# Patient Record
Sex: Male | Born: 1995 | Race: Black or African American | Hispanic: No | Marital: Single | Smoking: Never smoker
Health system: Southern US, Community
[De-identification: ages and names within clinical notes are randomized; demographics above are authoritative.]

## PROBLEM LIST (undated history)

## (undated) DIAGNOSIS — J45909 Unspecified asthma, uncomplicated: Secondary | ICD-10-CM

---
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
## 2017-01-10 DIAGNOSIS — Z9109 Other allergy status, other than to drugs and biological substances: Secondary | ICD-10-CM | POA: Insufficient documentation

## 2017-01-10 DIAGNOSIS — J45909 Unspecified asthma, uncomplicated: Secondary | ICD-10-CM | POA: Insufficient documentation

## 2019-04-26 ENCOUNTER — Ambulatory Visit: Payer: Self-pay | Attending: Internal Medicine

## 2019-04-26 DIAGNOSIS — Z23 Encounter for immunization: Secondary | ICD-10-CM

## 2019-04-26 NOTE — Progress Notes (Signed)
   Covid-19 Vaccination Clinic  Name:  John Levy    MRN: [id number] DOB: November 08, 1995  04/26/2019  [name] was observed post Covid-19 immunization for 15 minutes without incident. He was provided with Vaccine Information Sheet and instruction to access the V-Safe system.   [name] was instructed to call 911 with any severe reactions post vaccine: Marland Kitchen Difficulty breathing  . Swelling of face and throat  . A fast heartbeat  . A bad rash all over body  . Dizziness and weakness   Immunizations Administered    Name Date Dose VIS Date Route   Pfizer COVID-19 Vaccine 04/26/2019  2:18 PM 0.3 mL 12/29/2018 Intramuscular   Manufacturer: [employer], [employer]   Lot: [id number]   NDC: [address]-1

## 2019-05-21 ENCOUNTER — Ambulatory Visit: Payer: Self-pay | Attending: Internal Medicine

## 2019-05-21 DIAGNOSIS — Z23 Encounter for immunization: Secondary | ICD-10-CM

## 2019-05-21 NOTE — Progress Notes (Signed)
   Covid-19 Vaccination Clinic  Name:  John Levy    MRN: [id number] DOB: February 25, 1995  05/21/2019  John Levy was observed post Covid-19 immunization for 15 minutes without incident. He was provided with Vaccine Information Sheet and instruction to access the V-Safe system.   John Levy was instructed to call 911 with any severe reactions post vaccine: Marland Kitchen Difficulty breathing  . Swelling of face and throat  . A fast heartbeat  . A bad rash all over body  . Dizziness and weakness   Immunizations Administered    Name Date Dose VIS Date Route   Pfizer COVID-19 Vaccine 05/21/2019 10:00 AM 0.3 mL 03/14/2018 Intramuscular   Manufacturer: [employer], [employer]   Lot: [id number]   NDC: [address]-8

## 2021-12-09 ENCOUNTER — Ambulatory Visit
Admission: EM | Admit: 2021-12-09 | Discharge: 2021-12-09 | Disposition: A | Discharge status: Home / Self Care | Payer: BC Managed Care – PPO | Attending: Internal Medicine | Admitting: Internal Medicine

## 2021-12-09 ENCOUNTER — Ambulatory Visit (INDEPENDENT_AMBULATORY_CARE_PROVIDER_SITE_OTHER): Payer: BC Managed Care – PPO

## 2021-12-09 ENCOUNTER — Ambulatory Visit
Admission: EM | Admit: 2021-12-09 | Discharge: 2021-12-09 | Discharge status: Against Medical Advice | Payer: BC Managed Care – PPO

## 2021-12-09 VITALS — BP 120/80 | HR 85 | Temp 97.2°F | Resp 16

## 2021-12-09 DIAGNOSIS — M25511 Pain in right shoulder: Secondary | ICD-10-CM

## 2021-12-09 DIAGNOSIS — M5459 Other low back pain: Secondary | ICD-10-CM

## 2021-12-09 DIAGNOSIS — M545 Low back pain, unspecified: Secondary | ICD-10-CM

## 2021-12-09 DIAGNOSIS — S39012A Strain of muscle, fascia and tendon of lower back, initial encounter: Secondary | ICD-10-CM

## 2021-12-09 MED ORDER — METHOCARBAMOL 500 MG PO TABS: 500.0000 mg | ORAL_TABLET | Freq: Two times a day (BID) | ORAL | 0 refills | Status: AC | PRN

## 2021-12-09 NOTE — Discharge Instructions (Signed)
It appears you have strained your back.  I have prescribed a muscle relaxer to take as needed.  Please be advised that this can cause drowsiness so do not drive or drink alcohol with taking it.  You may alternate ice and heat to affected area of your back as well.  Your shoulder x-ray was normal.  Follow-up with orthopedist at provided contact information for further evaluation and management given this is a chronic issue.

## 2021-12-09 NOTE — ED Triage Notes (Signed)
Pt is present today with lower back pain and right shoulder pain. Pt states that his back pain started last night and the shoulder pain started a few years ago. Pt states that his shoulder normally pops out of place and pops back in the socket and he notices numbness afterwards

## 2021-12-09 NOTE — ED Provider Notes (Signed)
[facility]    CSN: [id number] Arrival date & time: 12/09/21  1700      History   Chief Complaint Chief Complaint  Patient presents with   Back Pain   Shoulder Pain    HPI John Levy is a 26 y.o. male.   Patient presents with right shoulder and right lower back pain.  Patient reports that right lower back pain started yesterday.  He denies any obvious injury but does report that he does all a lot of weight lifting.  He denies urinary frequency, urinary or bowel continence, hematuria, saddle anesthesia.  Patient has taken Advil with some improvement in symptoms.  Patient also reporting some right shoulder discomfort.  He states that since 2016, his shoulder will pop in and out of place with certain movements.  He states approximately 3 days ago he was walking up the steps and grabbed the guardrail while falling.  He states that he had subsequent pain after shoulder felt like it "popped out of place".  He states that he he can easily pop it back in place and the pain will subside.  He has never been seen by orthopedist or PCP for this.   Back Pain Shoulder Pain   Past Medical History:  Diagnosis Date   Asthma     There are no problems to display for this patient.   History reviewed. No pertinent surgical history.     Home Medications    Prior to Admission medications   Medication Sig Start Date End Date Taking? Authorizing Provider  methocarbamol (ROBAXIN) 500 MG tablet Take 1 tablet (500 mg total) by mouth 2 (two) times daily as needed for muscle spasms. 12/09/21  Yes Jay Haskew, Acie Fredrickson, FNP  albuterol (PROVENTIL HFA;VENTOLIN HFA) 108 (90 Base) MCG/ACT inhaler Inhale 1-2 puffs into the lungs every 6 (six) hours as needed for wheezing or shortness of breath. 06/14/15   Antony Madura, PA-C  fexofenadine (ALLEGRA) 180 MG tablet Take 180 mg by mouth daily as needed for allergies or rhinitis.    [provider]  predniSONE (DELTASONE) 20 MG tablet Take 2  tablets (40 mg total) by mouth daily. 06/14/15   Antony Madura, PA-C    Family History History reviewed. No pertinent family history.  Social History Social History   Tobacco Use   Smoking status: Never   Smokeless tobacco: Never  Substance Use Topics   Alcohol use: No   Drug use: No     Allergies   Patient has no known allergies.   Review of Systems Review of Systems Per HPI  Physical Exam Triage Vital Signs ED Triage Vitals [12/09/21 1718]  Enc Vitals Group     BP 120/80     Pulse Rate 85     Resp 16     Temp (!) 97.2 F (36.2 C)     Temp src      SpO2 95 %     Weight      Height      Head Circumference      Peak Flow      Pain Score 7     Pain Loc      Pain Edu?      Excl. in GC?    No data found.  Updated Vital Signs BP 120/80   Pulse 85   Temp (!) 97.2 F (36.2 C)   Resp 16   SpO2 95%   Visual Acuity Right Eye Distance:   Left Eye Distance:  Bilateral Distance:    Right Eye Near:   Left Eye Near:    Bilateral Near:     Physical Exam Constitutional:      General: He is not in acute distress.    Appearance: Normal appearance. He is not toxic-appearing or diaphoretic.  HENT:     Head: Normocephalic and atraumatic.  Eyes:     Extraocular Movements: Extraocular movements intact.     Conjunctiva/sclera: Conjunctivae normal.  Pulmonary:     Effort: Pulmonary effort is normal.  Musculoskeletal:       Back:     Comments: No tenderness to palpation to right lower back.  No direct spinal tenderness, crepitus, step-off noted.  Patient reports movement exacerbates pain.  No tenderness to palpation throughout shoulder or upper extremity.  Patient has full range of motion of upper extremity.  Grip strength 5/5.  Neurovascular intact.  Neurological:     General: No focal deficit present.     Mental Status: He is alert and oriented to person, place, and time. Mental status is at baseline.     Deep Tendon Reflexes: Reflexes are normal and  symmetric.  Psychiatric:        Mood and Affect: Mood normal.        Behavior: Behavior normal.        Thought Content: Thought content normal.        Judgment: Judgment normal.      UC Treatments / Results  Labs (all labs ordered are listed, but only abnormal results are displayed) Labs Reviewed - No data to display  EKG   Radiology DG Shoulder Right  Result Date: 12/09/2021 CLINICAL DATA:  Right shoulder pain. EXAM: RIGHT SHOULDER - 2+ VIEW COMPARISON:  None Available. FINDINGS: The glenohumeral and AC joints are maintained. No acute bony findings or abnormal soft tissue calcifications. IMPRESSION: No fracture or dislocation. Electronically Signed   By: Marijo Sanes M.D.   On: 12/09/2021 17:53    Procedures Procedures (including critical care time)  Medications Ordered in UC Medications - No data to display  Initial Impression / Assessment and Plan / UC Course  I have reviewed the triage vital signs and the nursing notes.  Pertinent labs & imaging results that were available during my care of the patient were reviewed by me and considered in my medical decision making (see chart for details).     Suspect lumbar strain causing back pain given physical exam.  Given no direct spinal tenderness or injury, will defer imaging.  Will treat with muscle relaxer.  Patient advised that muscle relaxer can cause drowsiness and do not drive or drink alcohol while taking it.  Discussed safe over-the-counter pain relievers to supplement with.  Discussed alternating ice and heat to affected area as well.  Right shoulder x-ray was negative for any acute bony abnormality.  Given the chronicity of issue, patient was advised to follow-up with orthopedist at provided contact information for further evaluation and management.  Discussed supportive care as well. discussed strict return and ER precautions for all chief complaints today.  Patient verbalized understanding and was agreeable with  plan. Final Clinical Impressions(s) / UC Diagnoses   Final diagnoses:  Acute right-sided low back pain without sciatica  Strain of lumbar region, initial encounter  Acute pain of right shoulder     Discharge Instructions      It appears you have strained your back.  I have prescribed a muscle relaxer to take as needed.  Please be advised  that this can cause drowsiness so do not drive or drink alcohol with taking it.  You may alternate ice and heat to affected area of your back as well.  Your shoulder x-ray was normal.  Follow-up with orthopedist at provided contact information for further evaluation and management given this is a chronic issue.     ED Prescriptions     Medication Sig Dispense Auth. Provider   methocarbamol (ROBAXIN) 500 MG tablet Take 1 tablet (500 mg total) by mouth 2 (two) times daily as needed for muscle spasms. 20 tablet [place], [address] [address], [place]      I have reviewed the PDMP during this encounter.   Gustavus Bryant, [place] 12/09/21 [phone]

## 2022-01-26 ENCOUNTER — Ambulatory Visit: Payer: BC Managed Care – PPO | Admitting: Family Medicine

## 2022-01-26 ENCOUNTER — Encounter: Payer: Self-pay | Admitting: Family Medicine

## 2022-01-26 VITALS — BP 129/83 | HR 80 | Temp 98.3°F | Resp 16 | Ht 67.0 in | Wt 188.2 lb

## 2022-01-26 DIAGNOSIS — J4520 Mild intermittent asthma, uncomplicated: Secondary | ICD-10-CM | POA: Insufficient documentation

## 2022-01-26 DIAGNOSIS — J302 Other seasonal allergic rhinitis: Secondary | ICD-10-CM | POA: Insufficient documentation

## 2022-01-26 DIAGNOSIS — Z7689 Persons encountering health services in other specified circumstances: Secondary | ICD-10-CM

## 2022-01-26 DIAGNOSIS — J4540 Moderate persistent asthma, uncomplicated: Secondary | ICD-10-CM

## 2022-01-26 MED ORDER — ALBUTEROL SULFATE HFA 108 (90 BASE) MCG/ACT IN AERS: 2.0000 | INHALATION_SPRAY | RESPIRATORY_TRACT | 3 refills | Status: DC | PRN

## 2022-01-26 MED ORDER — MONTELUKAST SODIUM 10 MG PO TABS: 10.0000 mg | ORAL_TABLET | Freq: Every day | ORAL | 1 refills | Status: DC

## 2022-01-26 MED ORDER — FLUTICASONE PROPIONATE HFA 220 MCG/ACT IN AERO: INHALATION_SPRAY | RESPIRATORY_TRACT | 5 refills | Status: DC

## 2022-01-26 MED ORDER — ALBUTEROL SULFATE (2.5 MG/3ML) 0.083% IN NEBU: INHALATION_SOLUTION | RESPIRATORY_TRACT | 3 refills | Status: DC

## 2022-01-26 NOTE — Progress Notes (Unsigned)
Patient is here to established care with provider today. Patient has many health concern they would like to discuss with provider today  Care gaps discuss at appointment today  

## 2022-01-27 ENCOUNTER — Encounter: Payer: Self-pay | Admitting: Family Medicine

## 2022-01-27 NOTE — Progress Notes (Signed)
New Patient Office Visit  Subjective    Patient ID: John Levy, male    DOB: 1995-05-03  Age: 27 y.o. MRN: [id number]  CC:  Chief Complaint  Patient presents with   Establish Care    HPI John Levy presents to establish care and for review of asthma. He denies acute complaints.    Outpatient Encounter Medications as of 01/26/2022  Medication Sig   [DISCONTINUED] albuterol (VENTOLIN HFA) 108 (90 Base) MCG/ACT inhaler Inhale 2 puffs into the lungs every 4 (four) hours as needed.   [DISCONTINUED] montelukast (SINGULAIR) 10 MG tablet Take 1 tablet by mouth at bedtime.   albuterol (PROVENTIL HFA;VENTOLIN HFA) 108 (90 Base) MCG/ACT inhaler Inhale 1-2 puffs into the lungs every 6 (six) hours as needed for wheezing or shortness of breath.   albuterol (PROVENTIL) (2.5 MG/3ML) 0.083% nebulizer solution USE 1 VIAL IN NEBULIZER EVERY 4 TO 6 HOURS AS NEEDED   albuterol (VENTOLIN HFA) 108 (90 Base) MCG/ACT inhaler Inhale 2 puffs into the lungs every 4 (four) hours as needed.   fexofenadine (ALLEGRA) 180 MG tablet Take 180 mg by mouth daily as needed for allergies or rhinitis.   fluticasone (FLOVENT HFA) 220 MCG/ACT inhaler INHALE 1 PUFF BY MOUTH TWICE DAILY TO  PREVENT  ASTHMA  SYMPTOMS  RINSE  MOUTH  WELL  AFTER  USE   methocarbamol (ROBAXIN) 500 MG tablet Take 1 tablet (500 mg total) by mouth 2 (two) times daily as needed for muscle spasms.   montelukast (SINGULAIR) 10 MG tablet Take 1 tablet (10 mg total) by mouth at bedtime.   [DISCONTINUED] albuterol (PROVENTIL) (2.5 MG/3ML) 0.083% nebulizer solution USE 1 VIAL IN NEBULIZER EVERY 4 TO 6 HOURS AS NEEDED   [DISCONTINUED] fluticasone (FLOVENT HFA) 220 MCG/ACT inhaler INHALE 1 PUFF BY MOUTH TWICE DAILY TO  PREVENT  ASTHMA  SYMPTOMS  RINSE  MOUTH  WELL  AFTER  USE   [DISCONTINUED] predniSONE (DELTASONE) 20 MG tablet Take 2 tablets (40 mg total) by mouth daily.   No facility-administered encounter medications on file as of 01/26/2022.    Past  Medical History:  Diagnosis Date   Asthma     No past surgical history on file.  No family history on file.  Social History   Socioeconomic History   Marital status: Single    Spouse name: Not on file   Number of children: Not on file   Years of education: Not on file   Highest education level: Not on file  Occupational History   Not on file  Tobacco Use   Smoking status: Never   Smokeless tobacco: Never  Substance and Sexual Activity   Alcohol use: No   Drug use: No   Sexual activity: Never  Other Topics Concern   Not on file  Social History Narrative   Not on file   Social Determinants of Health   Financial Resource Strain: Not on file  Food Insecurity: Not on file  Transportation Needs: Not on file  Physical Activity: Not on file  Stress: Not on file  Social Connections: Not on file  Intimate Partner Violence: Not on file    Review of Systems  All other systems reviewed and are negative.       Objective    BP 129/83   Pulse 80   Temp 98.3 F (36.8 C) (Oral)   Resp 16   Ht 5\' 7"  (1.702 m)   Wt 188 lb 3.2 oz (85.4 kg)   SpO2 95%  BMI 29.48 kg/m   Physical Exam Vitals and nursing note reviewed.  Constitutional:      General: He is not in acute distress. Cardiovascular:     Rate and Rhythm: Normal rate and regular rhythm.  Pulmonary:     Effort: Pulmonary effort is normal.     Breath sounds: Normal breath sounds.  Abdominal:     Palpations: Abdomen is soft.     Tenderness: There is no abdominal tenderness.  Neurological:     General: No focal deficit present.     Mental Status: He is alert and oriented to person, place, and time.         Assessment & Plan:   1. Moderate persistent asthma without complication Discussed appropriate management. Flovent , albuterol, and singulair prescribe.   2. Encounter to establish care     Return in about 3 months (around 04/27/2022) for follow up, physical.   Becky Sax, MD

## 2022-02-10 ENCOUNTER — Telehealth: Payer: Self-pay | Admitting: Family Medicine

## 2022-02-10 ENCOUNTER — Other Ambulatory Visit: Payer: Self-pay | Admitting: Family Medicine

## 2022-02-10 NOTE — Telephone Encounter (Signed)
[facility] no longer carries fluticasone (FLOVENT HFA) 220 MCG/ACT inhaler / they need an alternate sent to the pharmacy / please call pt when new RX is sent

## 2022-02-10 NOTE — Telephone Encounter (Signed)
Please advise patient.  

## 2022-02-12 ENCOUNTER — Other Ambulatory Visit: Payer: Self-pay | Admitting: Family Medicine

## 2022-02-12 MED ORDER — FLUTICASONE PROPIONATE HFA 220 MCG/ACT IN AERO: 2.0000 | INHALATION_SPRAY | Freq: Two times a day (BID) | RESPIRATORY_TRACT | 12 refills | Status: DC

## 2022-02-15 ENCOUNTER — Other Ambulatory Visit: Payer: Self-pay | Admitting: Family Medicine

## 2022-02-22 ENCOUNTER — Ambulatory Visit: Payer: Self-pay | Admitting: *Deleted

## 2022-02-22 NOTE — Telephone Encounter (Signed)
Mother is calling back concerned about about med but understanding For Dr. Redmond Pulling   See notes. Triage today...stating  He needs an alternative inhaler called in.    Doesn't look like it was called in from last week.

## 2022-02-22 NOTE — Telephone Encounter (Signed)
Reason for Disposition  [1] Prescription not at pharmacy AND [2] was prescribed by PCP recently (Exception: [facility] has access to EMR and prescription is recorded there. Go to Home Care and confirm for pharmacy.)  Answer Assessment - Initial Assessment Questions 1. NAME of MEDICINE: "What medicine(s) are you calling about?"     I'm to have a FloVent.   Insurance not covering this anymore.    My dr. Was to send in a different medication for it.    2. QUESTION: "What is your question?" (e.g., double dose of medicine, side effect)     Dr. Redmond Pulling was supposed to send in an alternative for me.   The pharmacy does not have it. 3. PRESCRIBER: "Who prescribed the medicine?" Reason: if prescribed by specialist, call should be referred to that group.     Dr. Dorna Mai 4. SYMPTOMS: "Do you have any symptoms?" If Yes, ask: "What symptoms are you having?"  "How bad are the symptoms (e.g., mild, moderate, severe)     N/A 5. PREGNANCY:  "Is there any chance that you are pregnant?" "When was your last menstrual period?"     N/A  Protocols used: Medication Question Call-A-AH

## 2022-02-22 NOTE — Telephone Encounter (Signed)
  Chief Complaint: Dr. Redmond Pulling was going to call in a different inhaler in place of the FloVent inhaler because he can no longer get that brand.   The alternative has not been called into his pharmacy.    Symptoms: N/A Frequency: N/A Pertinent Negatives: Patient denies N/A Disposition: [] ED /[] Urgent Care (no appt availability in office) / [] Appointment(In office/virtual)/ []  [facility]/ [] Home Care/ [] Refused Recommended Disposition /[] [facility]/ [x]  Follow-up with PCP Additional Notes: Needs an alternative inhaler called in in place of the FloVent inhaler he can no longer get. He would like someone to call him back and let him know when it has been sent.

## 2022-02-23 ENCOUNTER — Other Ambulatory Visit: Payer: Self-pay | Admitting: Family Medicine

## 2022-02-23 MED ORDER — FLUTICASONE PROPIONATE (INHAL) 250 MCG/ACT IN AEPB: 250.0000 ug | INHALATION_SPRAY | Freq: Two times a day (BID) | RESPIRATORY_TRACT | 2 refills | Status: DC

## 2022-04-29 ENCOUNTER — Encounter: Payer: BC Managed Care – PPO | Admitting: Family Medicine

## 2022-07-01 ENCOUNTER — Other Ambulatory Visit: Payer: Self-pay | Admitting: Family Medicine

## 2022-07-08 ENCOUNTER — Other Ambulatory Visit: Payer: Self-pay | Admitting: *Deleted

## 2023-03-16 ENCOUNTER — Emergency Department (HOSPITAL_COMMUNITY)
Admission: EM | Admit: 2023-03-16 | Discharge: 2023-03-16 | Disposition: A | Discharge status: Home / Self Care | Payer: Medicaid Other | Attending: Emergency Medicine | Admitting: Emergency Medicine

## 2023-03-16 ENCOUNTER — Encounter (HOSPITAL_COMMUNITY): Payer: Self-pay | Admitting: *Deleted

## 2023-03-16 ENCOUNTER — Other Ambulatory Visit: Payer: Self-pay

## 2023-03-16 DIAGNOSIS — Z7951 Long term (current) use of inhaled steroids: Secondary | ICD-10-CM | POA: Insufficient documentation

## 2023-03-16 DIAGNOSIS — R062 Wheezing: Secondary | ICD-10-CM | POA: Diagnosis present

## 2023-03-16 DIAGNOSIS — J4541 Moderate persistent asthma with (acute) exacerbation: Secondary | ICD-10-CM | POA: Insufficient documentation

## 2023-03-16 MED ORDER — ALBUTEROL SULFATE HFA 108 (90 BASE) MCG/ACT IN AERS: 2.0000 | INHALATION_SPRAY | RESPIRATORY_TRACT | 3 refills | Status: AC | PRN

## 2023-03-16 MED ORDER — PREDNISONE 20 MG PO TABS
60.0000 mg | ORAL_TABLET | Freq: Once | ORAL | Status: AC
  Administered 2023-03-16: 60 mg via ORAL
  Filled 2023-03-16: qty 3

## 2023-03-16 MED ORDER — ALBUTEROL SULFATE (2.5 MG/3ML) 0.083% IN NEBU: INHALATION_SOLUTION | RESPIRATORY_TRACT | 3 refills | Status: AC

## 2023-03-16 MED ORDER — IPRATROPIUM-ALBUTEROL 0.5-2.5 (3) MG/3ML IN SOLN
3.0000 mL | Freq: Once | RESPIRATORY_TRACT | Status: AC
  Administered 2023-03-16: 3 mL via RESPIRATORY_TRACT
  Filled 2023-03-16: qty 3

## 2023-03-16 MED ORDER — METHYLPREDNISOLONE 4 MG PO TBPK: ORAL_TABLET | ORAL | 0 refills | Status: AC

## 2023-03-16 MED ORDER — MONTELUKAST SODIUM 10 MG PO TABS: 10.0000 mg | ORAL_TABLET | Freq: Every day | ORAL | 1 refills | Status: AC

## 2023-03-16 MED ORDER — FLUTICASONE PROPIONATE HFA 220 MCG/ACT IN AERO: 2.0000 | INHALATION_SPRAY | Freq: Two times a day (BID) | RESPIRATORY_TRACT | 12 refills | Status: AC

## 2023-03-16 NOTE — ED Triage Notes (Signed)
 The pt is c/o chest tightness for 2 days with some sob  he is an [employer] and has not gotten his inhaler refilled  no audible wheezes just an occassional cough

## 2023-03-16 NOTE — ED Provider Notes (Signed)
 [place] EM[facility] DEPARTMENT AT [facility] Provider Note   CSN: [id number] Arrival date & time: 03/16/23  1800     History  Chief Complaint  Patient presents with   Chest Pain    John Levy is a 29 y.o. male.  Patient with history of asthma presents today with complaints of asthma exacerbation.  He states that he ran out of his inhalers 2 days ago and needs a refill.  States he is having some wheezing and a little tightness in his chest consistent with when his asthma flares up.  He denies any shortness of breath.  No fevers or chills.  No cough or congestion.  The history is provided by the patient. No language interpreter was used.  Chest Pain      Home Medications Prior to Admission medications   Medication Sig Start Date End Date Taking? Authorizing Provider  fluticasone (FLOVENT HFA) 220 MCG/ACT inhaler Inhale 2 puffs into the lungs 2 (two) times daily. 02/12/22   Georganna Skeans, MD  albuterol (PROVENTIL HFA;VENTOLIN HFA) 108 [phone] Base) MCG/ACT inhaler Inhale 1-2 puffs into the lungs every 6 (six) hours as needed for wheezing or shortness of breath. 06/14/15   Antony Madura, PA-C  albuterol (PROVENTIL) (2.5 MG/3ML) 0.083% nebulizer solution USE 1 VIAL IN NEBULIZER EVERY 4 TO 6 HOURS AS NEEDED 01/26/22   Georganna Skeans, MD  albuterol (VENTOLIN HFA) 108 (90 Base) MCG/ACT inhaler Inhale 2 puffs into the lungs every 4 (four) hours as needed. 01/26/22   Georganna Skeans, MD  fexofenadine (ALLEGRA) 180 MG tablet Take 180 mg by mouth daily as needed for allergies or rhinitis.    [provider]  Fluticasone Propionate, Inhal, (FLUTICASONE PROPIONATE DISKUS) 250 MCG/ACT AEPB INHALE 1 PUFF TWICE DAILY 07/08/22   Georganna Skeans, MD  methocarbamol (ROBAXIN) 500 MG tablet Take 1 tablet (500 mg total) by mouth 2 (two) times daily as needed for muscle spasms. 12/09/21   Gustavus Bryant, FNP  montelukast (SINGULAIR) 10 MG tablet Take 1 tablet (10 mg total) by mouth at bedtime.  01/26/22   Georganna Skeans, MD      Allergies    Dust mite extract    Review of Systems   Review of Systems  Respiratory:  Positive for wheezing.   Cardiovascular:  Positive for chest pain.  All other systems reviewed and are negative.   Physical Exam Updated Vital Signs BP 139/87   Pulse (!) 106   Temp 98.7 F (37.1 C)   Resp 18   Ht 5\' 7"  (1.702 m)   Wt 85.4 kg   SpO2 94%   BMI 29.49 kg/m  Physical Exam Vitals and nursing note reviewed.  Constitutional:      General: He is not in acute distress.    Appearance: Normal appearance. He is normal weight. He is not ill-appearing, toxic-appearing or diaphoretic.  HENT:     Head: Normocephalic and atraumatic.  Cardiovascular:     Rate and Rhythm: Normal rate and regular rhythm.     Heart sounds: Normal heart sounds.  Pulmonary:     Effort: Pulmonary effort is normal. No respiratory distress.     Comments: Mild expiratory wheezing present throughout Abdominal:     Palpations: Abdomen is soft.     Tenderness: There is no abdominal tenderness.  Musculoskeletal:        General: Normal range of motion.     Cervical back: Normal range of motion.     Right lower leg: No tenderness.  No edema.     Left lower leg: No tenderness. No edema.  Skin:    General: Skin is warm and dry.  Neurological:     General: No focal deficit present.     Mental Status: He is alert.  Psychiatric:        Mood and Affect: Mood normal.        Behavior: Behavior normal.     ED Results / Procedures / Treatments   Labs (all labs ordered are listed, but only abnormal results are displayed) Labs Reviewed - No data to display  EKG None  Radiology No results found.  Procedures Procedures    Medications Ordered in ED Medications  ipratropium-albuterol (DUONEB) 0.5-2.5 (3) MG/3ML nebulizer solution 3 mL (3 mLs Nebulization Given 03/16/23 1918)  predniSONE (DELTASONE) tablet 60 mg (60 mg Oral Given 03/16/23 1913)    ED Course/ Medical  Decision Making/ A&P                                 Medical Decision Making Risk Prescription drug management.   Patient presents today with complaints of asthma exacerbation.  He is afebrile, nontoxic-appearing, and in no acute distress with reassuring vital signs.  Physical exam reveals patient well-appearing, speaking in complete sentences, mild expiratory wheezing present throughout consistent with patient's asthma.  Given a DuoNeb treatment and a dose of prednisone with significant improvement.  Patient feels ready to go home.  Suspect patient's symptoms are due to his asthma in the setting of being out of his home medications.  I have refilled his albuterol his Flovent and his Singulair per his request.  Will also send for a Medrol Dosepak. Evaluation and diagnostic testing in the emergency department does not suggest an emergent condition requiring admission or immediate intervention beyond what has been performed at this time.  Plan for discharge with close PCP follow-up.  Patient is understanding and amenable with plan, educated on red flag symptoms that would prompt immediate return.  Patient discharged in stable condition.  Final Clinical Impression(s) / ED Diagnoses Final diagnoses:  Moderate persistent asthma with acute exacerbation    Rx / DC Orders ED Discharge Orders          Ordered    fluticasone (FLOVENT HFA) 220 MCG/ACT inhaler  2 times daily        03/16/23 1952    albuterol (PROVENTIL) (2.5 MG/3ML) 0.083% nebulizer solution        03/16/23 1952    albuterol (VENTOLIN HFA) 108 (90 Base) MCG/ACT inhaler  Every 4 hours PRN        03/16/23 1952    montelukast (SINGULAIR) 10 MG tablet  Daily at bedtime        03/16/23 1952    methylPREDNISolone (MEDROL DOSEPAK) 4 MG TBPK tablet        03/16/23 1952          An After Visit Summary was printed and given to the patient.     Vear Clock 03/16/23 1955    Tegeler, Canary Brim, MD 03/17/23 Moses Manners

## 2023-03-16 NOTE — Discharge Instructions (Signed)
 As we discussed, I suspect that you are having an exacerbation of your asthma in the setting of being out of your medication.  Given that you feel better with 1 round of steroids and a nebulizer treatment, no further evaluation is indicated.  I have refilled your medications for you.  Please take them as prescribed as needed.  I have also given you a Medrol Dosepak which is a steroid taper you should take as prescribed in its entirety.  Please follow-up closely with your primary doctor for long-term management of your asthma.  Return if development of any new or worsening symptoms.

## 2023-09-07 ENCOUNTER — Emergency Department (HOSPITAL_COMMUNITY)
Admission: EM | Admit: 2023-09-07 | Discharge: 2023-09-08 | Disposition: A | Discharge status: Home / Self Care | Attending: Emergency Medicine | Admitting: Emergency Medicine

## 2023-09-07 ENCOUNTER — Other Ambulatory Visit: Payer: Self-pay

## 2023-09-07 ENCOUNTER — Encounter (HOSPITAL_COMMUNITY): Payer: Self-pay

## 2023-09-07 DIAGNOSIS — K649 Unspecified hemorrhoids: Secondary | ICD-10-CM

## 2023-09-07 DIAGNOSIS — K644 Residual hemorrhoidal skin tags: Secondary | ICD-10-CM | POA: Insufficient documentation

## 2023-09-07 LAB — COMPREHENSIVE METABOLIC PANEL WITH GFR
ALT: 37 U/L (ref 0–44)
AST: 56 U/L — ABNORMAL HIGH (ref 15–41)
Albumin: 4.4 g/dL (ref 3.5–5.0)
Alkaline Phosphatase: 58 U/L (ref 38–126)
Anion gap: 12 (ref 5–15)
BUN: 12 mg/dL (ref 6–20)
CO2: 22 mmol/L (ref 22–32)
Calcium: 9.8 mg/dL (ref 8.9–10.3)
Chloride: 103 mmol/L (ref 98–111)
Creatinine, Ser: 1.29 mg/dL — ABNORMAL HIGH (ref 0.61–1.24)
GFR, Estimated: >60 mL/min (ref >60)
Glucose, Bld: 105 mg/dL — ABNORMAL HIGH (ref 70–99)
Potassium: 3.7 mmol/L (ref 3.5–5.1)
Sodium: 137 mmol/L (ref 135–145)
Total Bilirubin: 0.9 mg/dL (ref 0.0–1.2)
Total Protein: 7.9 g/dL (ref 6.5–8.1)

## 2023-09-07 LAB — TYPE AND SCREEN
ABO/RH(D): O POS
Antibody Screen: NEGATIVE

## 2023-09-07 LAB — CBC
HCT: 47 % (ref 39.0–52.0)
Hemoglobin: 16.6 g/dL (ref 13.0–17.0)
MCH: 30.5 pg (ref 26.0–34.0)
MCHC: 35.3 g/dL (ref 30.0–36.0)
MCV: 86.2 fL (ref 80.0–100.0)
Platelets: 309 K/uL (ref 150–400)
RBC: 5.45 MIL/uL (ref 4.22–5.81)
RDW: 11.9 % (ref 11.5–15.5)
WBC: 4.7 K/uL (ref 4.0–10.5)
nRBC: 0 % (ref 0.0–0.2)

## 2023-09-07 NOTE — ED Triage Notes (Signed)
 Pt states blood in stool today, hx of same. No abd pain/n/v/dizziness.

## 2023-09-07 NOTE — ED Triage Notes (Signed)
 PT arrives via POV. Pt reports pain and some bleeding from a hemorrhoid that started yesterday. Pt reports hx of. Pt is AxOx4.

## 2023-09-08 MED ORDER — HYDROCORTISONE (PERIANAL) 2.5 % EX CREA: 1.0000 | TOPICAL_CREAM | Freq: Two times a day (BID) | CUTANEOUS | 0 refills | Status: AC

## 2023-09-08 NOTE — Discharge Instructions (Addendum)
 Take the prescribed medication as directed.  Continue to eat high fiber diet, lots of water to ease bowel movements.  Can use stool softener if needed.   Sitz bath mix available at pharmacy as well which sometimes helps.  Donut pillow may be useful when traveling. Follow-up with your doctor. Return to the ED for new or worsening symptoms.

## 2023-09-08 NOTE — ED Notes (Signed)
 This RN reviewed discharge instructions with patient. he verbalized understanding and denied any further questions. PT well appearing upon discharge and reports no pain. Pt ambulated with stable gait to exit. Pt endorses ride home.

## 2023-09-08 NOTE — ED Provider Notes (Signed)
 [place] EM[facility]GENCY DEPARTMENT AT [facility] Provider Note   CSN: [id number] Arrival date & time: 09/07/23  8166     Patient presents with: Hemorrhoids   John Levy is a 28 y.o. male.   The history is provided by the patient and medical records.   28 year old male presenting to the ED with concern of hemorrhoids.  States he first noticed these about a month ago after a long flight for vacation in [place].  States he used some symptomatic care at home and it seemed to resolve.  Has started to recur over the past few days, did take a road trip to [place]  over the weekend so was sitting in a car for long hours.  He reports scant amount of blood when wiping but denies any blood mixed in stool.  He denies any nausea, vomiting, fever, or abdominal pain.  Prior to Admission medications   Medication Sig Start Date End Date Taking? Authorizing Provider  hydrocortisone  (ANUSOL -HC) 2.5 % rectal cream Place 1 Application rectally 2 (two) times daily. 09/08/23  Yes Jarold Olam HERO, PA-C  albuterol  (PROVENTIL ) (2.5 MG/3ML) 0.083% nebulizer solution USE 1 VIAL IN NEBULIZER EVERY 4 TO 6 HOURS AS NEEDED 03/16/23   Smoot, Lauraine LABOR, PA-C  albuterol  (VENTOLIN  HFA) 108 (90 Base) MCG/ACT inhaler Inhale 2 puffs into the lungs every 4 (four) hours as needed. 03/16/23   Smoot, Lauraine LABOR, PA-C  fexofenadine (ALLEGRA) 180 MG tablet Take 180 mg by mouth daily as needed for allergies or rhinitis.    [provider]  fluticasone  (FLOVENT  HFA) 220 MCG/ACT inhaler Inhale 2 puffs into the lungs 2 (two) times daily. 03/16/23   Smoot, Lauraine LABOR, PA-C  Fluticasone  Propionate, Inhal, (FLUTICASONE  PROPIONATE DISKUS) 250 MCG/ACT AEPB INHALE 1 PUFF TWICE DAILY 07/08/22   Tanda Bleacher, MD  methocarbamol  (ROBAXIN ) 500 MG tablet Take 1 tablet (500 mg total) by mouth 2 (two) times daily as needed for muscle spasms. 12/09/21   Hazen Darryle BRAVO, FNP  methylPREDNISolone  (MEDROL  DOSEPAK) 4 MG TBPK tablet Take as directed on  package 03/16/23   Smoot, Lauraine LABOR, PA-C  montelukast  (SINGULAIR ) 10 MG tablet Take 1 tablet (10 mg total) by mouth at bedtime. 03/16/23   Smoot, Lauraine LABOR, PA-C    Allergies: Dust mite extract    Review of Systems  Gastrointestinal:  Positive for anal bleeding.  All other systems reviewed and are negative.   Updated Vital Signs BP (!) 131/97 (BP Location: Right Arm)   Pulse (!) 121   Temp 98.4 F (36.9 C)   Resp 17   Ht 5' 7 (1.702 m)   Wt 81.6 kg   SpO2 99%   BMI 28.19 kg/m   Physical Exam Vitals and nursing note reviewed.  Constitutional:      Appearance: He is well-developed.  HENT:     Head: Normocephalic and atraumatic.  Eyes:     Conjunctiva/sclera: Conjunctivae normal.     Pupils: Pupils are equal, round, and reactive to light.  Cardiovascular:     Rate and Rhythm: Normal rate and regular rhythm.     Heart sounds: Normal heart sounds.  Pulmonary:     Effort: Pulmonary effort is normal.     Breath sounds: Normal breath sounds.  Genitourinary:    Comments: Very small, nonthrombosed, nonbleeding external hemorrhoid, this is not significantly tender on exam Musculoskeletal:        General: Normal range of motion.     Cervical back: Normal range of motion.  Skin:    General: Skin is warm and dry.  Neurological:     Mental Status: He is alert and oriented to person, place, and time.     (all labs ordered are listed, but only abnormal results are displayed) Labs Reviewed  COMPREHENSIVE METABOLIC PANEL WITH GFR - Abnormal; Notable for the following components:      Result Value   Glucose, Bld 105 (*)    Creatinine, Ser 1.29 (*)    AST 56 (*)    All other components within normal limits  CBC  POC OCCULT BLOOD, ED  TYPE AND SCREEN    EKG: None  Radiology: No results found.   Procedures   Medications Ordered in the ED - No data to display                                  Medical Decision Making Amount and/or Complexity of Data Reviewed Labs:  ordered. ECG/medicine tests: ordered and independent interpretation performed.  Risk Prescription drug management.   28 year old male presenting to the ED with hemorrhoids.  Onset about a month ago after a long flight to [place].  Had recurrence over the past few days.  Some scant bleeding with wiping.  No abdominal pain.  Labs today are grossly reassuring, hemoglobin is stable.  Rectal exam with small, nonthrombosed, nonbleeding hemorrhoid.  Can continue symptomatic care, increase fiber in diet and fluid intake.  Can use Anusol  as needed.  Also discussed sitz bath.  Can follow-up with PCP.  Return here for new concerns.  Final diagnoses:  Hemorrhoids, unspecified hemorrhoid type    ED Discharge Orders          Ordered    hydrocortisone  (ANUSOL -HC) 2.5 % rectal cream  2 times daily        09/08/23 0301               Jarold Olam HERO, PA-C 09/08/23 [id number]    Lorette Mayo, MD 09/08/23 [phone]
